# Patient Record
Sex: Male | Born: 1960 | Hispanic: Yes | Smoking: Former smoker
Health system: Southern US, Community
[De-identification: ages and names within clinical notes are randomized; demographics above are authoritative.]

## PROBLEM LIST (undated history)

## (undated) HISTORY — PX: OTHER SURGICAL HISTORY: SHX169

---

## 2015-04-26 DEATH — deceased

## 2021-01-12 ENCOUNTER — Observation Stay (HOSPITAL_COMMUNITY)
Admission: EM | Admit: 2021-01-12 | Discharge: 2021-01-13 | Disposition: A | Discharge status: Home / Self Care | Payer: No Typology Code available for payment source | Attending: Surgery | Admitting: Surgery

## 2021-01-12 ENCOUNTER — Emergency Department (HOSPITAL_COMMUNITY): Payer: No Typology Code available for payment source

## 2021-01-12 ENCOUNTER — Encounter (HOSPITAL_COMMUNITY): Payer: Self-pay

## 2021-01-12 ENCOUNTER — Other Ambulatory Visit: Payer: Self-pay

## 2021-01-12 DIAGNOSIS — W1789XA Other fall from one level to another, initial encounter: Secondary | ICD-10-CM | POA: Diagnosis not present

## 2021-01-12 DIAGNOSIS — Y9269 Other specified industrial and construction area as the place of occurrence of the external cause: Secondary | ICD-10-CM | POA: Insufficient documentation

## 2021-01-12 DIAGNOSIS — J918 Pleural effusion in other conditions classified elsewhere: Secondary | ICD-10-CM | POA: Insufficient documentation

## 2021-01-12 DIAGNOSIS — S2241XA Multiple fractures of ribs, right side, initial encounter for closed fracture: Principal | ICD-10-CM | POA: Diagnosis present

## 2021-01-12 DIAGNOSIS — N281 Cyst of kidney, acquired: Secondary | ICD-10-CM | POA: Diagnosis not present

## 2021-01-12 DIAGNOSIS — Y990 Civilian activity done for income or pay: Secondary | ICD-10-CM | POA: Diagnosis not present

## 2021-01-12 DIAGNOSIS — S2249XA Multiple fractures of ribs, unspecified side, initial encounter for closed fracture: Secondary | ICD-10-CM

## 2021-01-12 DIAGNOSIS — Z20822 Contact with and (suspected) exposure to covid-19: Secondary | ICD-10-CM | POA: Insufficient documentation

## 2021-01-12 DIAGNOSIS — Z87891 Personal history of nicotine dependence: Secondary | ICD-10-CM | POA: Insufficient documentation

## 2021-01-12 DIAGNOSIS — I700 Atherosclerosis of aorta: Secondary | ICD-10-CM | POA: Insufficient documentation

## 2021-01-12 DIAGNOSIS — R7401 Elevation of levels of liver transaminase levels: Secondary | ICD-10-CM | POA: Diagnosis not present

## 2021-01-12 DIAGNOSIS — J439 Emphysema, unspecified: Secondary | ICD-10-CM | POA: Diagnosis not present

## 2021-01-12 DIAGNOSIS — S299XXA Unspecified injury of thorax, initial encounter: Secondary | ICD-10-CM | POA: Diagnosis present

## 2021-01-12 DIAGNOSIS — K8020 Calculus of gallbladder without cholecystitis without obstruction: Secondary | ICD-10-CM | POA: Insufficient documentation

## 2021-01-12 LAB — TYPE AND SCREEN
ABO/RH(D): O POS
Antibody Screen: NEGATIVE

## 2021-01-12 LAB — COMPREHENSIVE METABOLIC PANEL
ALT: 56 U/L — ABNORMAL HIGH (ref 0–44)
AST: 44 U/L — ABNORMAL HIGH (ref 15–41)
Albumin: 3.8 g/dL (ref 3.5–5.0)
Alkaline Phosphatase: 81 U/L (ref 38–126)
Anion gap: 6 (ref 5–15)
BUN: 13 mg/dL (ref 6–20)
CO2: 26 mmol/L (ref 22–32)
Calcium: 8.4 mg/dL — ABNORMAL LOW (ref 8.9–10.3)
Chloride: 106 mmol/L (ref 98–111)
Creatinine, Ser: 1.12 mg/dL (ref 0.61–1.24)
GFR, Estimated: >60 mL/min (ref >60)
Glucose, Bld: 130 mg/dL — ABNORMAL HIGH (ref 70–99)
Potassium: 4.3 mmol/L (ref 3.5–5.1)
Sodium: 138 mmol/L (ref 135–145)
Total Bilirubin: 0.7 mg/dL (ref 0.3–1.2)
Total Protein: 6.9 g/dL (ref 6.5–8.1)

## 2021-01-12 LAB — CBC WITH DIFFERENTIAL/PLATELET
Abs Immature Granulocytes: 0.01 10*3/uL (ref 0.00–0.07)
Basophils Absolute: 0 10*3/uL (ref 0.0–0.1)
Basophils Relative: 1 %
Eosinophils Absolute: 0.3 10*3/uL (ref 0.0–0.5)
Eosinophils Relative: 5 %
HCT: 40.4 % (ref 39.0–52.0)
Hemoglobin: 13.6 g/dL (ref 13.0–17.0)
Immature Granulocytes: 0 %
Lymphocytes Relative: 30 %
Lymphs Abs: 1.7 10*3/uL (ref 0.7–4.0)
MCH: 31.5 pg (ref 26.0–34.0)
MCHC: 33.7 g/dL (ref 30.0–36.0)
MCV: 93.5 fL (ref 80.0–100.0)
Monocytes Absolute: 0.6 10*3/uL (ref 0.1–1.0)
Monocytes Relative: 10 %
Neutro Abs: 3.1 10*3/uL (ref 1.7–7.7)
Neutrophils Relative %: 54 %
Platelets: 206 10*3/uL (ref 150–400)
RBC: 4.32 MIL/uL (ref 4.22–5.81)
RDW: 13.4 % (ref 11.5–15.5)
WBC: 5.7 10*3/uL (ref 4.0–10.5)
nRBC: 0 % (ref 0.0–0.2)

## 2021-01-12 LAB — GLUCOSE, CAPILLARY: Glucose-Capillary: 128 mg/dL — ABNORMAL HIGH (ref 70–99)

## 2021-01-12 LAB — SARS CORONAVIRUS 2 (TAT 6-24 HRS): SARS Coronavirus 2: NEGATIVE

## 2021-01-12 MED ORDER — ONDANSETRON 4 MG PO TBDP: 4.0000 mg | ORAL_TABLET | Freq: Four times a day (QID) | ORAL | Status: DC | PRN

## 2021-01-12 MED ORDER — MORPHINE SULFATE (PF) 4 MG/ML IV SOLN: 4.0000 mg | INTRAVENOUS | Status: DC | PRN

## 2021-01-12 MED ORDER — ENOXAPARIN SODIUM 30 MG/0.3ML ~~LOC~~ SOLN
30.0000 mg | Freq: Two times a day (BID) | SUBCUTANEOUS | Status: DC
  Administered 2021-01-13: 30 mg via SUBCUTANEOUS
  Filled 2021-01-12: qty 0.3

## 2021-01-12 MED ORDER — OXYCODONE HCL 5 MG PO TABS: 10.0000 mg | ORAL_TABLET | ORAL | Status: DC | PRN

## 2021-01-12 MED ORDER — HYDROMORPHONE HCL 1 MG/ML IJ SOLN
1.0000 mg | Freq: Once | INTRAMUSCULAR | Status: AC
Start: 2021-01-12 — End: 2021-01-12
  Administered 2021-01-12: 1 mg via INTRAVENOUS
  Filled 2021-01-12: qty 1

## 2021-01-12 MED ORDER — KETOROLAC TROMETHAMINE 30 MG/ML IJ SOLN
30.0000 mg | Freq: Four times a day (QID) | INTRAMUSCULAR | Status: DC
  Administered 2021-01-12 – 2021-01-13 (×3): 30 mg via INTRAVENOUS
  Filled 2021-01-12 (×3): qty 1

## 2021-01-12 MED ORDER — ONDANSETRON HCL 4 MG/2ML IJ SOLN: 4.0000 mg | Freq: Four times a day (QID) | INTRAMUSCULAR | Status: DC | PRN

## 2021-01-12 MED ORDER — OXYCODONE HCL 5 MG PO TABS
5.0000 mg | ORAL_TABLET | ORAL | Status: DC | PRN
  Administered 2021-01-13: 5 mg via ORAL
  Filled 2021-01-12: qty 1

## 2021-01-12 MED ORDER — WHITE PETROLATUM EX OINT
TOPICAL_OINTMENT | CUTANEOUS | Status: AC
  Filled 2021-01-12: qty 28.35

## 2021-01-12 MED ORDER — ACETAMINOPHEN 325 MG PO TABS: 650.0000 mg | ORAL_TABLET | ORAL | Status: DC | PRN

## 2021-01-12 MED ORDER — IOHEXOL 300 MG/ML  SOLN
100.0000 mL | Freq: Once | INTRAMUSCULAR | Status: AC | PRN
  Administered 2021-01-12: 100 mL via INTRAVENOUS

## 2021-01-12 MED ORDER — MORPHINE SULFATE (PF) 2 MG/ML IV SOLN: 2.0000 mg | INTRAVENOUS | Status: DC | PRN

## 2021-01-12 MED ORDER — POTASSIUM CHLORIDE IN NACL 20-0.9 MEQ/L-% IV SOLN
INTRAVENOUS | Status: DC
  Filled 2021-01-12: qty 1000

## 2021-01-12 NOTE — ED Notes (Signed)
Spoke with ED minilab, they are to initiate workman's comp specimen collection process. Transport upstairs delayed related to same.
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
## 2021-01-12 NOTE — ED Notes (Signed)
ED [facility], Rodman Pickle, in room, patient unable to void at this time. Patient instructed to hit call light when he can void and verbalized understanding of same.

## 2021-01-12 NOTE — ED Notes (Signed)
Attempted to call report x 1  

## 2021-01-12 NOTE — ED Notes (Signed)
Patient transported to CT 

## 2021-01-12 NOTE — ED Notes (Signed)
Pt able to move all extremities. Pt was ambulatory on scene.

## 2021-01-13 ENCOUNTER — Observation Stay (HOSPITAL_COMMUNITY): Payer: No Typology Code available for payment source

## 2021-01-13 LAB — COMPREHENSIVE METABOLIC PANEL
ALT: 61 U/L — ABNORMAL HIGH (ref 0–44)
AST: 43 U/L — ABNORMAL HIGH (ref 15–41)
Albumin: 3.9 g/dL (ref 3.5–5.0)
Alkaline Phosphatase: 67 U/L (ref 38–126)
Anion gap: 6 (ref 5–15)
BUN: 11 mg/dL (ref 6–20)
CO2: 28 mmol/L (ref 22–32)
Calcium: 9 mg/dL (ref 8.9–10.3)
Chloride: 103 mmol/L (ref 98–111)
Creatinine, Ser: 0.84 mg/dL (ref 0.61–1.24)
GFR, Estimated: >60 mL/min (ref >60)
Glucose, Bld: 127 mg/dL — ABNORMAL HIGH (ref 70–99)
Potassium: 4.7 mmol/L (ref 3.5–5.1)
Sodium: 137 mmol/L (ref 135–145)
Total Bilirubin: 1.4 mg/dL — ABNORMAL HIGH (ref 0.3–1.2)
Total Protein: 7.2 g/dL (ref 6.5–8.1)

## 2021-01-13 LAB — ABO/RH: ABO/RH(D): O POS

## 2021-01-13 LAB — CBC
HCT: 42.5 % (ref 39.0–52.0)
Hemoglobin: 14.3 g/dL (ref 13.0–17.0)
MCH: 31.8 pg (ref 26.0–34.0)
MCHC: 33.6 g/dL (ref 30.0–36.0)
MCV: 94.4 fL (ref 80.0–100.0)
Platelets: 220 10*3/uL (ref 150–400)
RBC: 4.5 MIL/uL (ref 4.22–5.81)
RDW: 13.8 % (ref 11.5–15.5)
WBC: 8.5 10*3/uL (ref 4.0–10.5)
nRBC: 0 % (ref 0.0–0.2)

## 2021-01-13 LAB — HIV ANTIBODY (ROUTINE TESTING W REFLEX): HIV Screen 4th Generation wRfx: NONREACTIVE

## 2021-01-13 MED ORDER — MORPHINE SULFATE (PF) 2 MG/ML IV SOLN
2.0000 mg | INTRAVENOUS | Status: DC | PRN
Start: 2021-01-13 — End: 2021-01-13

## 2021-01-13 MED ORDER — ACETAMINOPHEN 500 MG PO TABS: 1000.0000 mg | ORAL_TABLET | Freq: Four times a day (QID) | ORAL | 0 refills | Status: AC | PRN

## 2021-01-13 MED ORDER — ACETAMINOPHEN 500 MG PO TABS
1000.0000 mg | ORAL_TABLET | Freq: Four times a day (QID) | ORAL | Status: DC
  Administered 2021-01-13: 1000 mg via ORAL
  Filled 2021-01-13: qty 2

## 2021-01-13 MED ORDER — OXYCODONE HCL 5 MG PO TABS: 5.0000 mg | ORAL_TABLET | Freq: Four times a day (QID) | ORAL | 0 refills | Status: AC | PRN

## 2021-01-13 NOTE — Discharge Instructions (Signed)
Fractura de costilla Rib Fracture  Una fractura de costilla es la ruptura de uno de los huesos que la forman. Las costillas son Earlean Polka jaula que rodea la parte superior del pecho. La fractura o fisura de una costilla puede ser dolorosa pero no causa otros problemas. Urania fracturas de costillas se curan solas en 1a14meses. Cules son las causas?  Hacer movimientos una y [place] vez con mucha fuerza, como lanzar una pelota en el bisbol o tener una tos [place].  Un golpe directo en el pecho.  Cncer que se propag a los huesos. Cules son los signos o sntomas?  Dolor al inspirar o al toser.  Dolor cuando alguien presiona la zona lesionada.  Falta de aire. Cmo se trata? El tratamiento depende de la gravedad de Electrical engineer. En general:  Deshler fracturas de costillas se curan solas en 1a15meses.  La curacin puede tardar ms tiempo si tiene tos o si realiza actividades que [employer] lesin.  Durante el proceso de curacin, le podrn dar analgsicos para Financial controller.  Le ensearn a realizar ejercicios de respiracin profunda.  Las lesiones muy graves pueden requerir hospitalizacin o [place]. Siga estas instrucciones en su casa: Control del dolor, la rigidez y la hinchazn  Aplique hielo sobre la zona lesionada si se lo indican. Para hacer esto: ? Ponga el hielo en una bolsa plstica. ? Coloque una [employer] piel y Therapist, nutritional. ? Aplique el hielo durante 53minutos, 2 o 3veces por da. ? Retire el hielo si la piel se le pone de color rojo brillante. Esto es [employer]. Si no puede sentir dolor, calor o fro, tiene un mayor riesgo de que se dae la zona.  Use los medicamentos de venta libre y los recetados solamente como se lo haya indicado el mdico. Actividad  Evite las actividades que puedan causar dolor en la zona lesionada. Proteja la zona lesionada.  Aumente lentamente la actividad segn las indicaciones del  mdico. Instrucciones generales  Haga ejercicios de respiracin profunda en la forma que le indic el mdico. Es posible que le indiquen lo siguiente: ? Secondary school teacher respiraciones profundas varias veces al Training and development officer. ? Tosa varias veces al da mientras abraza una almohada. ? Use un dispositivo (espirmetro de incentivo) para Optometrist respiraciones profundas varias veces al da.  Beba suficiente lquido como para mantener el pis (orina) claro o de color amarillo plido.  No use cinturones ni sujetadores.  Cumpla con todas las visitas de seguimiento. Comunquese con un mdico si:  Tiene fiebre. Solicite ayuda de inmediato si:  Tiene dificultad para respirar.  Le falta el aire.  No puede dejar de toser.  Tose y elimina saliva espesa o con sangre.  Tiene ganas de vomitar (nuseas), vomita o tiene dolor de vientre (abdominal).  El dolor empeora y los medicamentos no Community education officer. Estos sntomas pueden Sales executive. Solicite ayuda de inmediato. Comunquese con el servicio de emergencias de su localidad (911 en los [place]).  No espere a ver si los sntomas desaparecen.  No conduzca por sus propios medios Principal Financial. Resumen  Una fractura de costilla es la ruptura de uno de los huesos que la forman.  Aplique hielo sobre la zona lesionada y tome los medicamentos para Glass blower/designer como se lo haya indicado el mdico.  Haga respiraciones profundas y [place]. Abrace una almohada cada vez que tosa. Esta informacin no tiene Psychologist, clinical  consejo del mdico. Asegrese de hacerle al mdico cualquier pregunta que tenga. Document Revised: 04/22/2020 Document Reviewed: 04/22/2020 Elsevier Patient Education  2021 Elsevier Inc.  

## 2021-01-13 NOTE — Care Management (Signed)
Patient is Worker's Compensation case.  WC Case Manager information received.   Marylou Mccoy, RN, CCM, BSN Cell: [phone] Fax: [phone] [email or web address]   Spoke with [facility] Case Manager by phone; pt has signed release for information to be sent.  Faxed updated clinical information to case manager, per his request.  WC Case Manager to follow for any discharge needs.   Quintella Baton, RN, BSN  Trauma/Neuro ICU Case Manager [phone]
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
## 2021-01-13 NOTE — Progress Notes (Signed)
Gave pt d/c instruction in spanish and [place] version. IV discharge, clean and intact. Explain the importance of using incentive spirometry at home to help expand his lungs. Pt has all his belongings, waiting for family to pick him up.

## 2022-07-12 IMAGING — CT CT CHEST W/ CM
2 of 5 series · 13 of 36 positions shown, 16 images · IV contrast (Omni 300)
Comparison: Chest radiograph 01/12/2021

CLINICAL DATA: 5 foot fall, chest and abdominal pain

EXAM:
CT CHEST, ABDOMEN, AND PELVIS WITH CONTRAST
TECHNIQUE: Multidetector CT imaging of the chest, abdomen and pelvis was
performed following the standard protocol during bolus
administration of intravenous contrast.
CONTRAST:  100mL OMNIPAQUE IOHEXOL 300 MG/ML  SOLN

[Series 3: cap with 5mm st · axial · 0.89mm/px · z∈[+884,+1439]mm · 10 of 137 slices shown, 13 images]
[im 13/137  mediastinal]
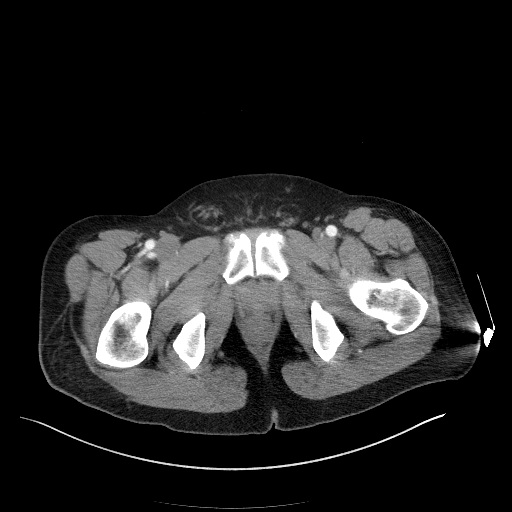
[im 13/137  lung]
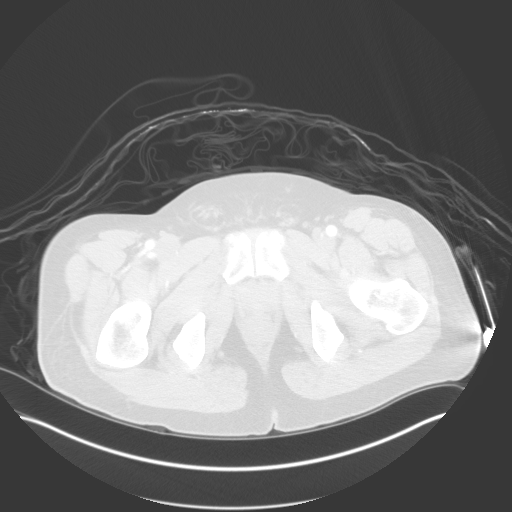
[im 25/137  lung]
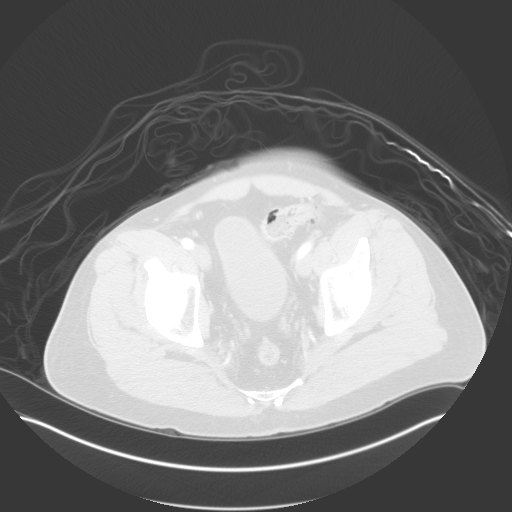
[im 38/137  lung]
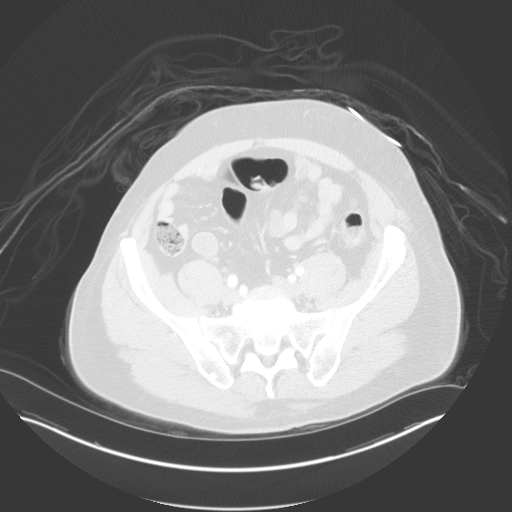
[im 50/137  lung]
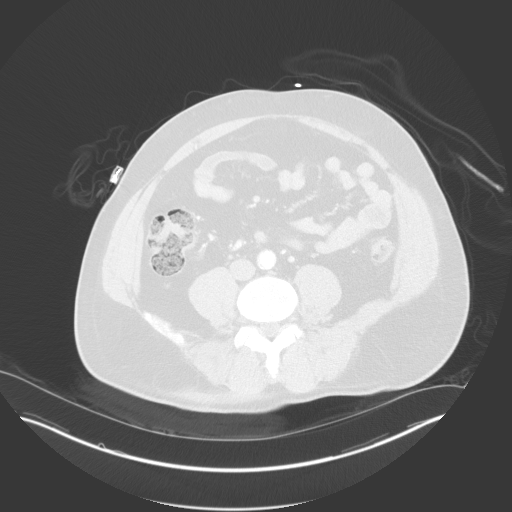
[im 62/137  mediastinal]
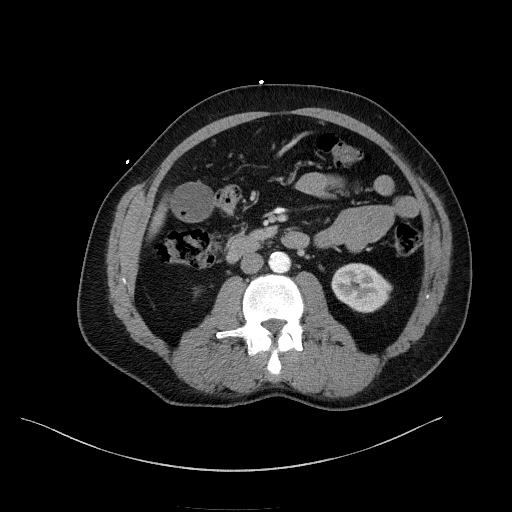
[im 62/137  lung]
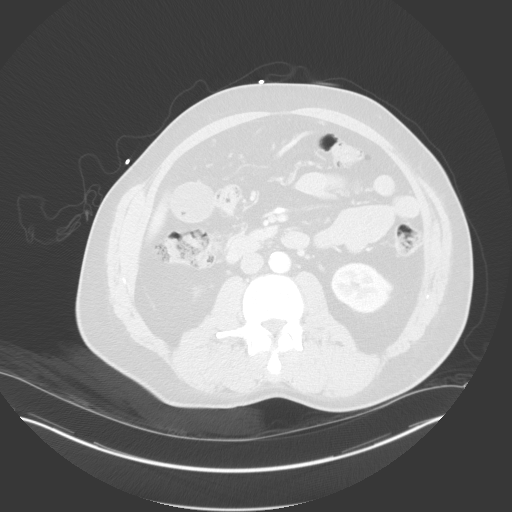
[im 75/137  lung]
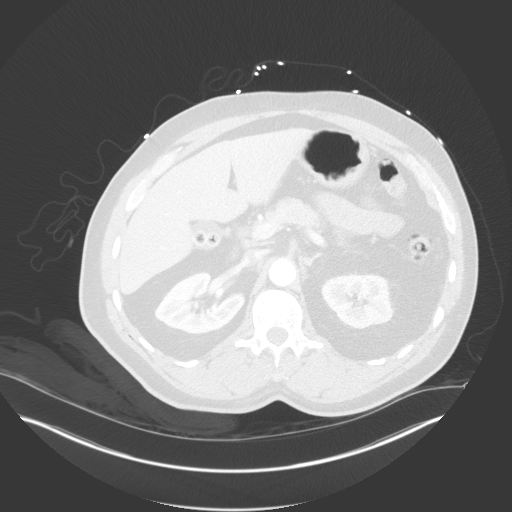
[im 87/137  lung]
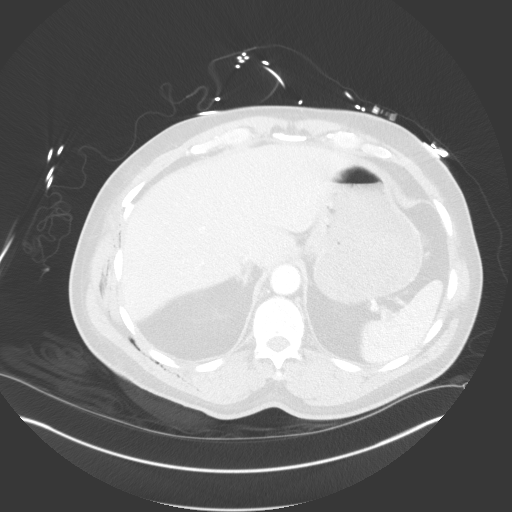
[im 99/137  lung]
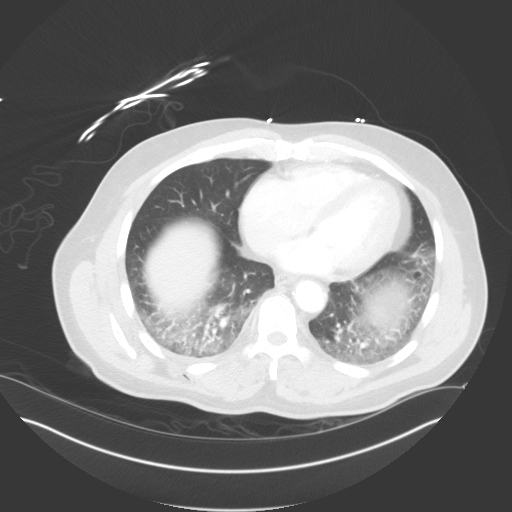
[im 112/137  mediastinal]
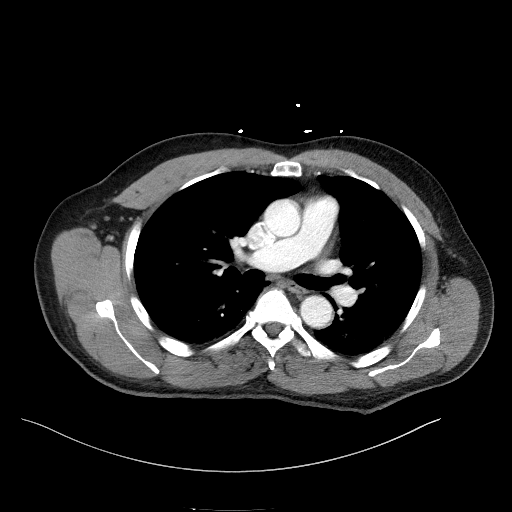
[im 112/137  lung]
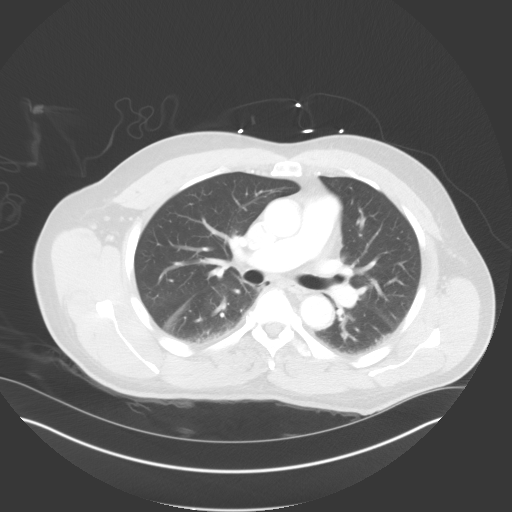
[im 124/137  lung]
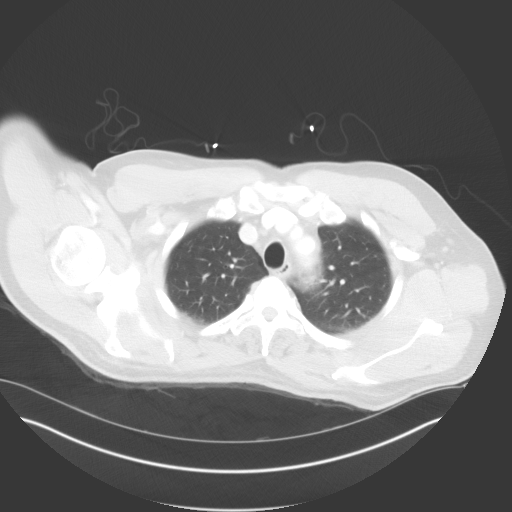

[Series 6: cap with 3mm st cor · coronal · 0.79mm/px · 3 of 162 slices shown]
[im 33/162  lung]
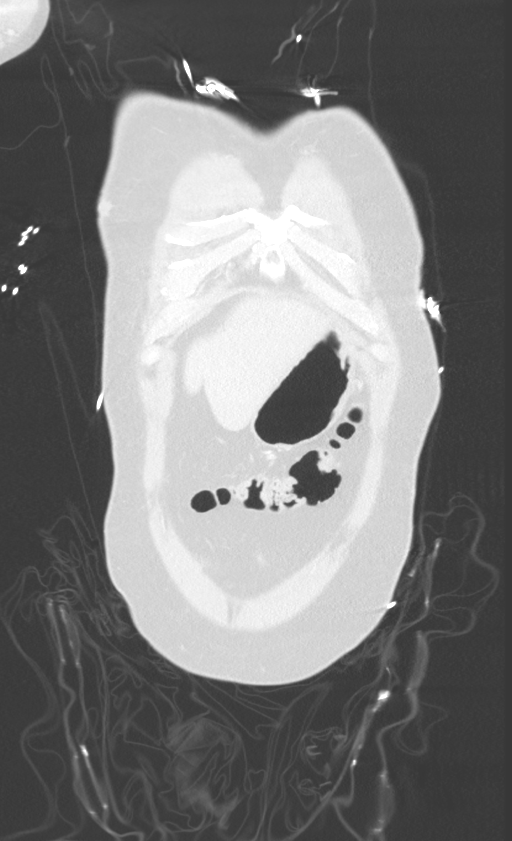
[im 65/162  lung]
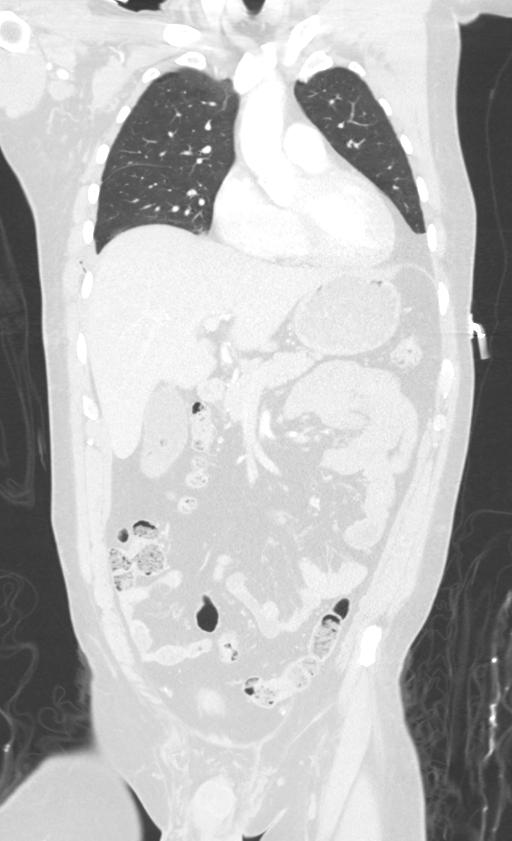
[im 97/162  lung]
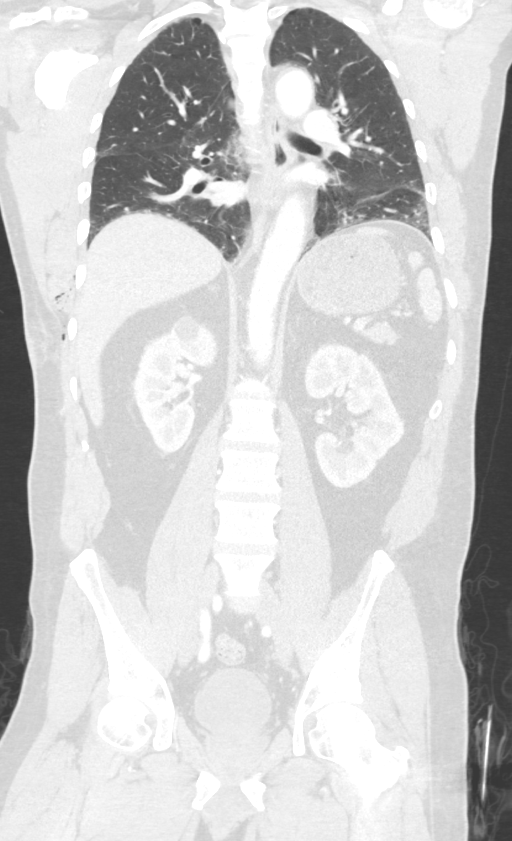

[13 of 36 positions shown; findings below may reference images not displayed]

FINDINGS: CT CHEST FINDINGS

Cardiovascular: Mild atherosclerotic calcification of the aortic
arch and aortic root. Borderline cardiomegaly.

Mediastinum/Nodes: Unremarkable

Lungs/Pleura: Mild paraseptal emphysema best seen at the lung
apices. Mild dependent atelectasis in both lower lobes with linear
subsegmental atelectasis or scarring in the superior segment right
lower lobe. Small right pleural effusion with blunting of the right
posterior costophrenic angle for example on image 69 series 7.
Despite the presence of the gas along the right lower
thoracoabdominal wall, I not see a well-defined pneumothorax.

Musculoskeletal: Acute fractures of the right sixth, seventh, and
eighth ribs laterally, and of the ninth and tenth ribs posteriorly.
The right seventh and eighth rib fractures are displaced by 1 bone
width. Gas tracks along the right thoracoabdominal wall in the
vicinity of the fractures.

CT ABDOMEN PELVIS FINDINGS

Hepatobiliary: Gallstones measuring up to 1.4 cm in diameter present
in the gallbladder with internal nitrogen gas phenomenon. No
perihepatic ascites for direct finding of hepatic laceration.

Pancreas: Unremarkable

Spleen: Unremarkable

Adrenals/Urinary Tract: 1.9 cm fluid density right kidney upper pole
lesion on image 97 series 6, likely a cyst. Adjacent 0.6 cm
hypodense lesion is also probably a cyst but technically
nonspecific.

The adrenal glands appear normal.  Urinary bladder unremarkable.

Stomach/Bowel: Unremarkable

Vascular/Lymphatic: Aortoiliac atherosclerotic vascular disease.

Reproductive: Borderline prostatomegaly.

Other: No supplemental non-categorized findings.

Musculoskeletal: Unremarkable
IMPRESSION: 1. Acute fractures of the right sixth, seventh, and eighth ribs
laterally, and of the ninth and tenth ribs posteriorly. The right
seventh and eighth rib fractures are displaced by 1 bone width. Gas
tracks along the right thoracoabdominal wall in the vicinity of the
fractures, and there is a trace amount of right pleural fluid but no
significant right pneumothorax is currently appreciable.
2. Other imaging findings of potential clinical significance:
Borderline cardiomegaly. Cholelithiasis. Borderline prostatomegaly.
Right kidney upper pole cysts.
3. Emphysema and aortic atherosclerosis.

Aortic Atherosclerosis (O4B33-G97.7) and Emphysema (O4B33-1DE.G).
